# Patient Record
Sex: Female | Born: 2000 | Race: White | Hispanic: No | Marital: Single | Smoking: Never smoker
Health system: Southern US, Community
[De-identification: ages and names within clinical notes are randomized; demographics above are authoritative.]

---

## 2020-05-30 ENCOUNTER — Emergency Department (HOSPITAL_COMMUNITY): Payer: BLUE CROSS/BLUE SHIELD

## 2020-05-30 ENCOUNTER — Emergency Department (HOSPITAL_COMMUNITY)
Admission: EM | Admit: 2020-05-30 | Discharge: 2020-05-30 | Disposition: A | Discharge status: Home / Self Care | Payer: BLUE CROSS/BLUE SHIELD | Attending: Emergency Medicine | Admitting: Emergency Medicine

## 2020-05-30 DIAGNOSIS — R569 Unspecified convulsions: Secondary | ICD-10-CM | POA: Diagnosis not present

## 2020-05-30 LAB — I-STAT BETA HCG BLOOD, ED (MC, WL, AP ONLY): I-stat hCG, quantitative: <5 m[IU]/mL (ref <5)

## 2020-05-30 LAB — HEPATIC FUNCTION PANEL
ALT: 12 U/L (ref 0–44)
AST: 27 U/L (ref 15–41)
Albumin: 4.1 g/dL (ref 3.5–5.0)
Alkaline Phosphatase: 54 U/L (ref 38–126)
Bilirubin, Direct: 0.3 mg/dL — ABNORMAL HIGH (ref 0.0–0.2)
Indirect Bilirubin: 0.3 mg/dL (ref 0.3–0.9)
Total Bilirubin: 0.6 mg/dL (ref 0.3–1.2)
Total Protein: 6.6 g/dL (ref 6.5–8.1)

## 2020-05-30 LAB — BASIC METABOLIC PANEL
Anion gap: 10 (ref 5–15)
BUN: 5 mg/dL — ABNORMAL LOW (ref 6–20)
CO2: 18 mmol/L — ABNORMAL LOW (ref 22–32)
Calcium: 8.2 mg/dL — ABNORMAL LOW (ref 8.9–10.3)
Chloride: 106 mmol/L (ref 98–111)
Creatinine, Ser: 0.98 mg/dL (ref 0.44–1.00)
GFR calc Af Amer: >60 mL/min (ref >60)
GFR calc non Af Amer: >60 mL/min (ref >60)
Glucose, Bld: 95 mg/dL (ref 70–99)
Potassium: 3.5 mmol/L (ref 3.5–5.1)
Sodium: 134 mmol/L — ABNORMAL LOW (ref 135–145)

## 2020-05-30 LAB — CBC WITH DIFFERENTIAL/PLATELET
Abs Immature Granulocytes: 0.01 10*3/uL (ref 0.00–0.07)
Basophils Absolute: 0.1 10*3/uL (ref 0.0–0.1)
Basophils Relative: 1 %
Eosinophils Absolute: 0.1 10*3/uL (ref 0.0–0.5)
Eosinophils Relative: 2 %
HCT: 40.1 % (ref 36.0–46.0)
Hemoglobin: 13.9 g/dL (ref 12.0–15.0)
Immature Granulocytes: 0 %
Lymphocytes Relative: 17 %
Lymphs Abs: 1.1 10*3/uL (ref 0.7–4.0)
MCH: 32.3 pg (ref 26.0–34.0)
MCHC: 34.7 g/dL (ref 30.0–36.0)
MCV: 93 fL (ref 80.0–100.0)
Monocytes Absolute: 0.4 10*3/uL (ref 0.1–1.0)
Monocytes Relative: 7 %
Neutro Abs: 4.9 10*3/uL (ref 1.7–7.7)
Neutrophils Relative %: 73 %
Platelets: 408 10*3/uL — ABNORMAL HIGH (ref 150–400)
RBC: 4.31 MIL/uL (ref 3.87–5.11)
RDW: 11.6 % (ref 11.5–15.5)
WBC: 6.6 10*3/uL (ref 4.0–10.5)
nRBC: 0 % (ref 0.0–0.2)

## 2020-05-30 LAB — ETHANOL: Alcohol, Ethyl (B): <10 mg/dL (ref <10)

## 2020-05-30 MED ORDER — SODIUM CHLORIDE 0.9 % IV BOLUS
1000.0000 mL | Freq: Once | INTRAVENOUS | Status: AC
  Administered 2020-05-30: 1000 mL via INTRAVENOUS

## 2020-05-30 NOTE — ED Provider Notes (Signed)
[facility] EMERGENCY DEPARTMENT Provider Note   CSN: [id number] Arrival date & time: 05/30/20  1719     History Chief Complaint  Patient presents with  . Seizures    Gloria Gay is a 19 y.o. female.  The history is provided by the patient.  Loss of Consciousness Episode history:  Single Most recent episode:  Today Progression:  Resolved Chronicity:  New Context: normal activity   Witnessed: yes   Relieved by:  Nothing Worsened by:  Nothing Associated symptoms: seizures (seizure like activity)   Associated symptoms: no chest pain, no confusion, no fever, no malaise/fatigue, no palpitations, no shortness of breath and no vomiting   Risk factors: no seizures        No past medical history on file.  There are no problems to display for this patient.  PMH: anxiety   OB History   No obstetric history on file.     No family history on file.  Social History   Tobacco Use  . Smoking status: Not on file  Substance Use Topics  . Alcohol use: Not on file  . Drug use: Not on file    Home Medications Prior to Admission medications   Not on File    Allergies    Patient has no allergy information on record.  Review of Systems   Review of Systems  Constitutional: Negative for chills, fever and malaise/fatigue.  HENT: Negative for ear pain and sore throat.   Eyes: Negative for pain and visual disturbance.  Respiratory: Negative for cough and shortness of breath.   Cardiovascular: Positive for syncope. Negative for chest pain and palpitations.  Gastrointestinal: Negative for abdominal pain and vomiting.  Genitourinary: Negative for dysuria and hematuria.  Musculoskeletal: Negative for arthralgias and back pain.  Skin: Negative for color change and rash.  Neurological: Positive for seizures (seizure like activity) and syncope.  Psychiatric/Behavioral: Negative for confusion.  All other systems reviewed and are negative.   Physical  Exam Updated Vital Signs  ED Triage Vitals  Enc Vitals Group     BP 05/30/20 1727 125/86     Pulse Rate 05/30/20 1727 92     Resp 05/30/20 1727 19     Temp --      Temp src --      SpO2 05/30/20 1727 100 %     Weight 05/30/20 1727 124 lb (56.2 kg)     Height 05/30/20 1727 5\' 2"  (1.575 m)     Head Circumference --      Peak Flow --      Pain Score 05/30/20 1725 0     Pain Loc --      Pain Edu? --      Excl. in GC? --     Physical Exam Vitals and nursing note reviewed.  Constitutional:      General: She is not in acute distress.    Appearance: She is well-developed. She is not ill-appearing.  HENT:     Head: Normocephalic and atraumatic.     Right Ear: Tympanic membrane normal.     Left Ear: Tympanic membrane normal.     Nose: Nose normal.     Mouth/Throat:     Mouth: Mucous membranes are moist.  Eyes:     Extraocular Movements: Extraocular movements intact.     Conjunctiva/sclera: Conjunctivae normal.     Pupils: Pupils are equal, round, and reactive to light.  Cardiovascular:     Rate and Rhythm: Normal rate  and regular rhythm.     Pulses: Normal pulses.     Heart sounds: Normal heart sounds. No murmur heard.   Pulmonary:     Effort: Pulmonary effort is normal. No respiratory distress.     Breath sounds: Normal breath sounds.  Abdominal:     Palpations: Abdomen is soft.     Tenderness: There is no abdominal tenderness.  Musculoskeletal:     Cervical back: Normal range of motion and neck supple.  Skin:    General: Skin is warm and dry.     Capillary Refill: Capillary refill takes less than 2 seconds.  Neurological:     General: No focal deficit present.     Mental Status: She is alert and oriented to person, place, and time.     Cranial Nerves: No cranial nerve deficit.     Sensory: No sensory deficit.     Motor: No weakness.     Coordination: Coordination normal.     Gait: Gait normal.     Comments: 5+ out of 5 strength throughout, normal sensation, no  drift, normal finger-nose-finger, normal speech     ED Results / Procedures / Treatments   Labs (all labs ordered are listed, but only abnormal results are displayed) Labs Reviewed  CBC WITH DIFFERENTIAL/PLATELET - Abnormal; Notable for the following components:      Result Value   Platelets 408 (*)    All other components within normal limits  BASIC METABOLIC PANEL - Abnormal; Notable for the following components:   Sodium 134 (*)    CO2 18 (*)    BUN 5 (*)    Calcium 8.2 (*)    All other components within normal limits  HEPATIC FUNCTION PANEL - Abnormal; Notable for the following components:   Bilirubin, Direct 0.3 (*)    All other components within normal limits  ETHANOL  RAPID URINE DRUG SCREEN, HOSP PERFORMED  I-STAT BETA HCG BLOOD, ED ([facility], [facility], [facility] ONLY)    EKG EKG Interpretation  Date/Time:  Saturday May 30 2020 17:28:55 EDT Ventricular Rate:  98 PR Interval:    QRS Duration: 92 QT Interval:  344 QTC Calculation: 440 R Axis:   79 Text Interpretation: Sinus rhythm Right atrial enlargement Consider right ventricular hypertrophy Confirmed by Virgina Norfolk [phone]) on 05/30/2020 5:30:19 PM   Radiology CT Head Wo Contrast  Result Date: 05/30/2020 CLINICAL DATA:  Seizure, nontraumatic (Age 75-40y) EXAM: CT HEAD WITHOUT CONTRAST TECHNIQUE: Contiguous axial images were obtained from the base of the skull through the vertex without intravenous contrast. COMPARISON:  None. FINDINGS: Brain: No evidence of acute infarction, hemorrhage, hydrocephalus, extra-axial collection or mass lesion/mass effect. Vascular: No hyperdense vessel or unexpected calcification. Skull: Negative for fracture or focal lesion. Sinuses/Orbits: Mild mucosal thickening of the left maxillary sinus. Otherwise paranasal sinuses and mastoid air cells are clear. Other: None. IMPRESSION: No acute intracranial abnormality. Electronically Signed   By: Tish Frederickson M.D.   On: 05/30/2020 19:15   DG Chest  Portable 1 View  Result Date: 05/30/2020 CLINICAL DATA:  Seizure-like episode. EXAM: PORTABLE CHEST 1 VIEW COMPARISON:  None. FINDINGS: The heart size and mediastinal contours are within normal limits. Both lungs are clear. The visualized skeletal structures are unremarkable. The right costophrenic angle is outside the field of view and therefore cannot be fully evaluated. There is an azygos lobe, a normal variant. IMPRESSION: No active disease. Electronically Signed   By: Katherine Mantle M.D.   On: 05/30/2020 19:33    Procedures  Procedures (including critical care time)  Medications Ordered in ED Medications  sodium chloride 0.9 % bolus 1,000 mL (0 mLs Intravenous Stopped 05/30/20 1939)    ED Course  I have reviewed the triage vital signs and the nursing notes.  Pertinent labs & imaging results that were available during my care of the patient were reviewed by me and considered in my medical decision making (see chart for details).    MDM Rules/Calculators/A&P                          Laurence Folz is a 19 year old female history of anxiety who presents to the ED after seizure-like episode.  Patient with overall unremarkable vitals.  Had seizure-like/syncopal event while at a store.  May be postictal.  Did not bite her tongue or have any incontinence.  No history of seizures.  Denies any extremity pain.  Feels anxious but otherwise feels okay.  Denies any heavy alcohol use.  Occasionally uses marijuana.  Patient is on some antidepressants and antianxiety pills.  She is on Bactrim for cellulitis at this time.  Lab work showed no significant anemia, ocular abnormality, kidney injury.  CT scan unremarkable.  EKG shows sinus rhythm.  Pregnancy test is negative.  Overall work-up is unremarkable.  No other seizure or syncopal events on the ED.  No significant anemia.  Given seizure precautions we will have her follow-up with neurology.  No driving her dangerous activities until cleared by  neurology.  No concern for infectious process such as meningitis.  Knows to return to the ED if symptoms worsen.  Discharged in good condition.  This chart was dictated using voice recognition software.  Despite best efforts to proofread,  errors can occur which can change the documentation meaning.    Final Clinical Impression(s) / ED Diagnoses Final diagnoses:  Seizure-like activity (HCC)    Rx / DC Orders ED Discharge Orders         Ordered    Ambulatory referral to Neurology       Comments: An appointment is requested in approximately: 1 week   05/30/20 2054           Virgina Norfolk, DO 05/30/20 2123

## 2020-05-30 NOTE — ED Triage Notes (Signed)
Pt was BIB by [facility] for seizure witnessed by bystanders, no hx seizures. Seizure lasted approximately 3 minutes. On EMS arrival patient was awake but disoriented. [facility] arrival, sinus tachy for [facility] (up to 140).  CBG 144 for [facility].  Denies ETOH/drug use

## 2020-05-30 NOTE — Discharge Instructions (Addendum)
Do not drink or operate heavy knee heavy machinery or partake in dangerous activities until you are cleared by neurology.  Please return to the ED if symptoms recur.  Please avoid any alcohol or drugs as well.

## 2022-01-11 IMAGING — CT CT HEAD W/O CM
4 series · 16 of 47 positions shown, 18 images · non-contrast
Comparison: None.

CLINICAL DATA: Seizure, nontraumatic (Age 18-40y)

EXAM:
CT HEAD WITHOUT CONTRAST
TECHNIQUE: Contiguous axial images were obtained from the base of the skull
through the vertex without intravenous contrast.

[Series 3: head wo · axial · 0.42mm/px · z∈[-130,-10]mm · 7 of 33 slices shown, 9 images]
[im 5/33  brain]
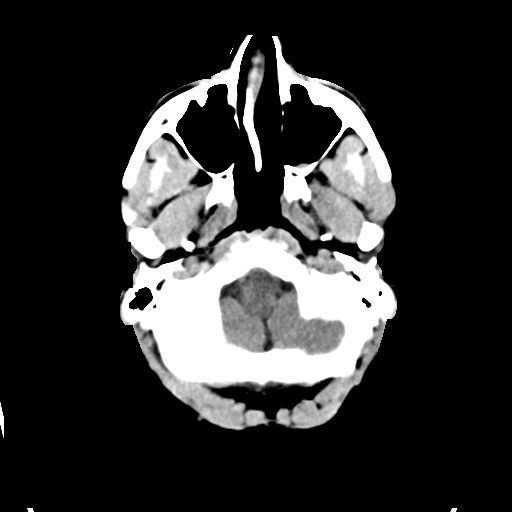
[im 5/33  bone]
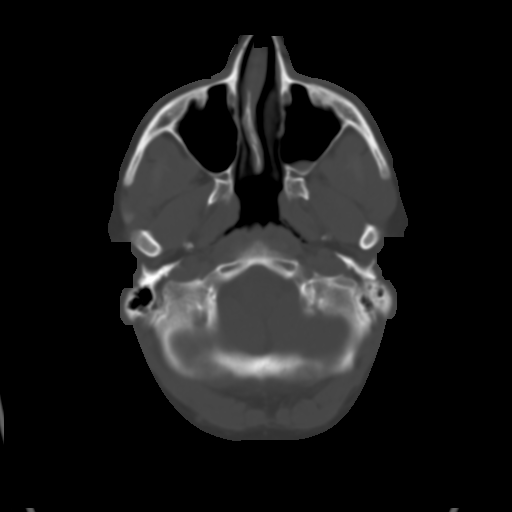
[im 9/33  brain]
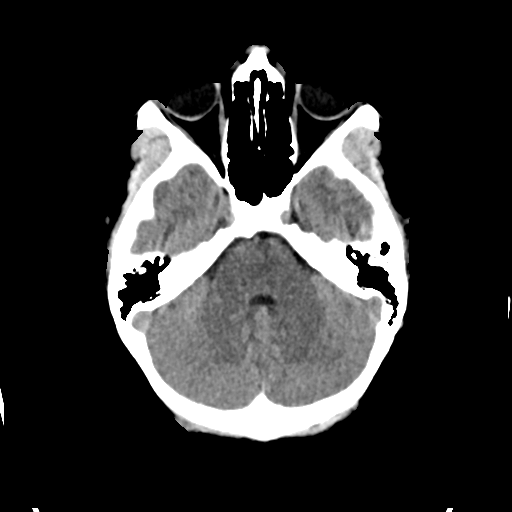
[im 13/33  brain]
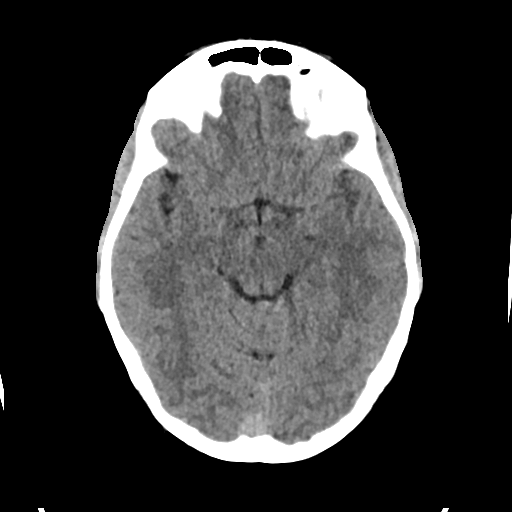
[im 17/33  brain]
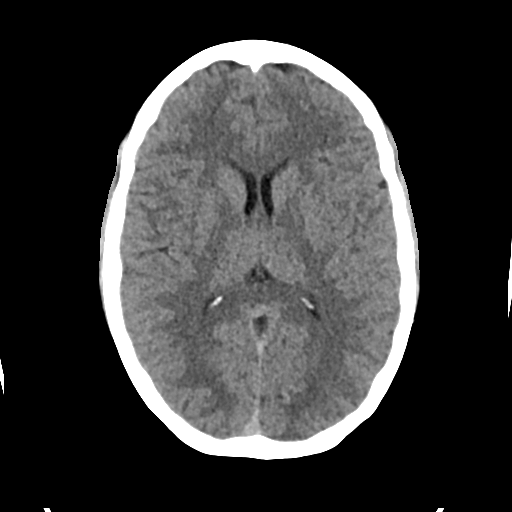
[im 21/33  brain]
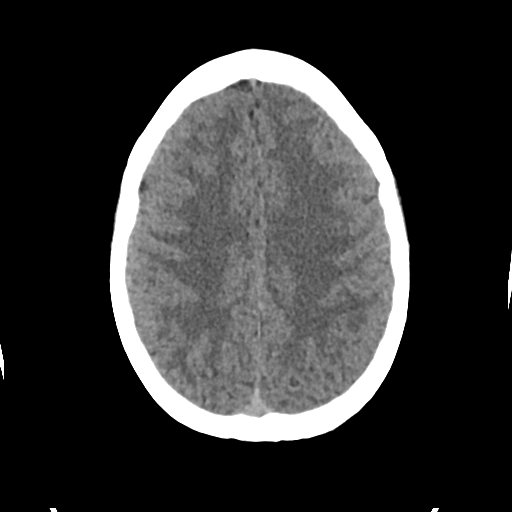
[im 21/33  bone]
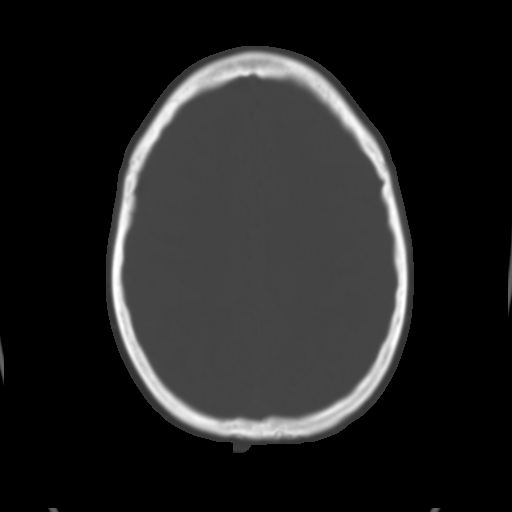
[im 25/33  brain]
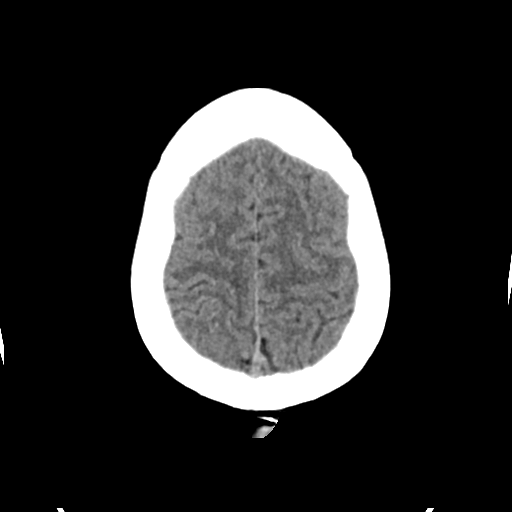
[im 29/33  brain]
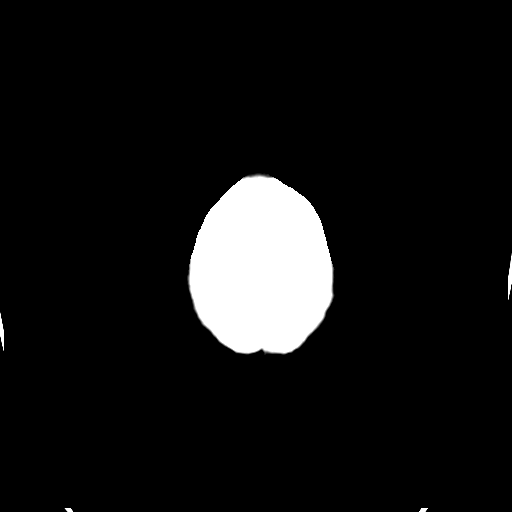

[Series 4: head bone · axial · 0.42mm/px · z∈[-134,-102]mm · 3 of 81 slices shown]
[im 9/81  bone]
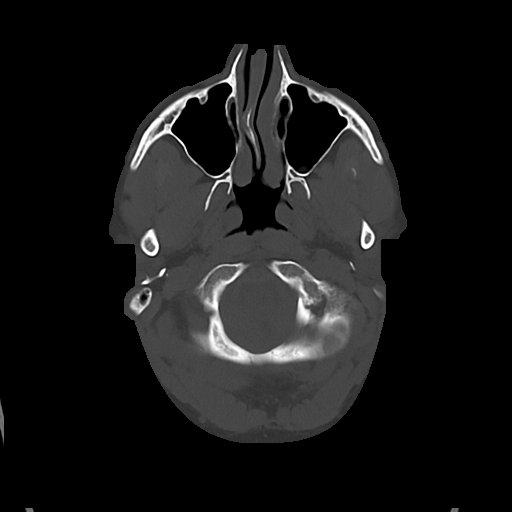
[im 17/81  bone]
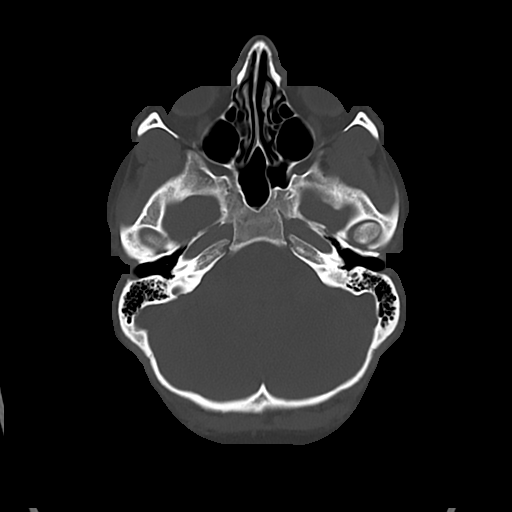
[im 25/81  bone]
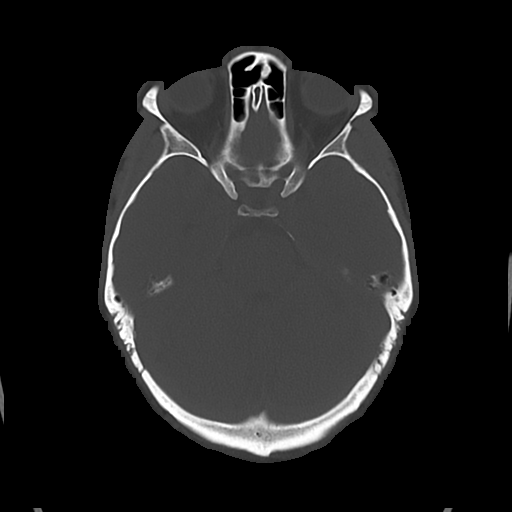

[Series 5: cor soft · coronal · 0.34mm/px · 3 of 71 slices shown]
[im 24/71  brain]
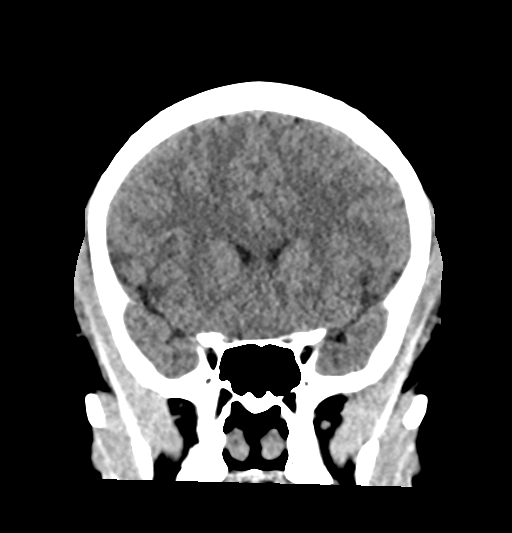
[im 32/71  brain]
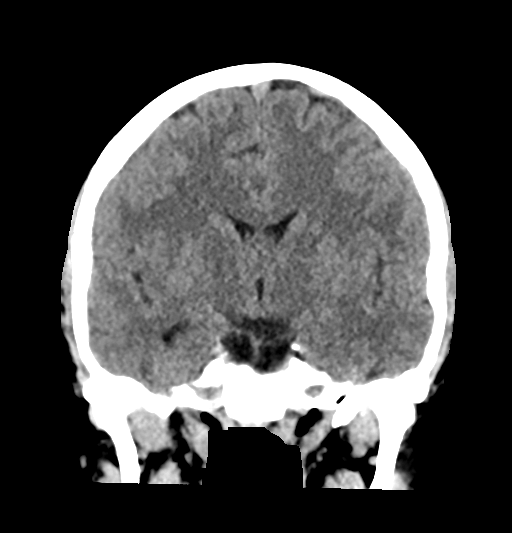
[im 39/71  brain]
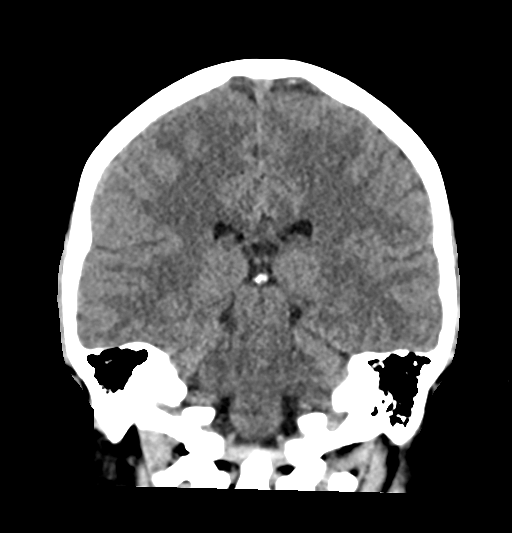

[Series 6: sag soft · sagittal · 0.35mm/px · 3 of 58 slices shown]
[im 20/58  brain]
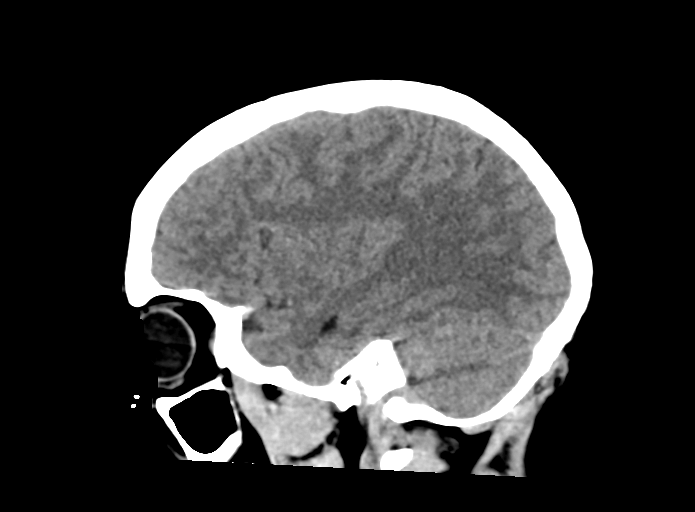
[im 29/58  brain]
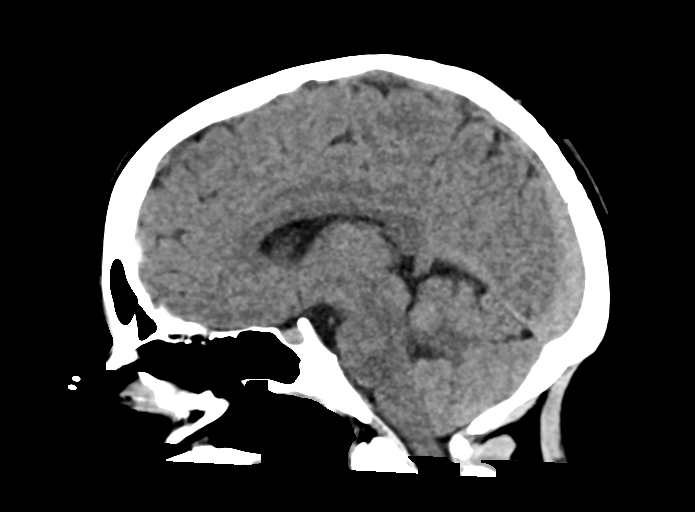
[im 39/58  brain]
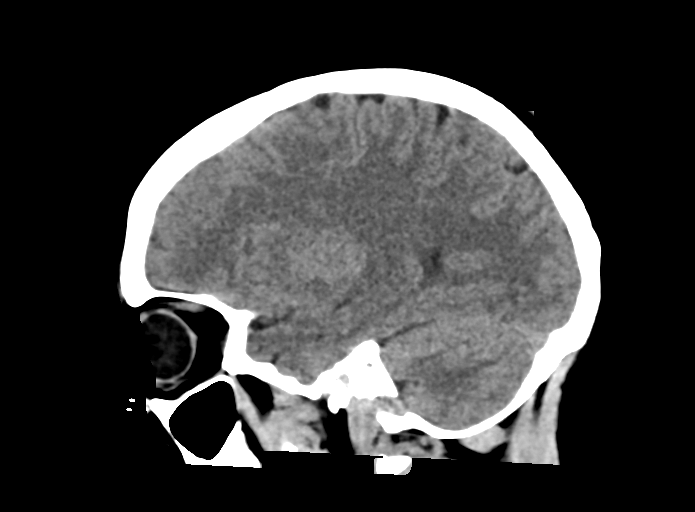

[16 of 47 positions shown; findings below may reference images not displayed]

FINDINGS: Brain: No evidence of acute infarction, hemorrhage, hydrocephalus,
extra-axial collection or mass lesion/mass effect.

Vascular: No hyperdense vessel or unexpected calcification.

Skull: Negative for fracture or focal lesion.

Sinuses/Orbits: Mild mucosal thickening of the left maxillary sinus.
Otherwise paranasal sinuses and mastoid air cells are clear.

Other: None.
IMPRESSION: No acute intracranial abnormality.

## 2022-01-11 IMAGING — DX DG CHEST 1V PORT
1 series · 1 of 1 positions shown · non-contrast
Comparison: None.

CLINICAL DATA: Seizure-like episode.

EXAM:
PORTABLE CHEST 1 VIEW

[chest]
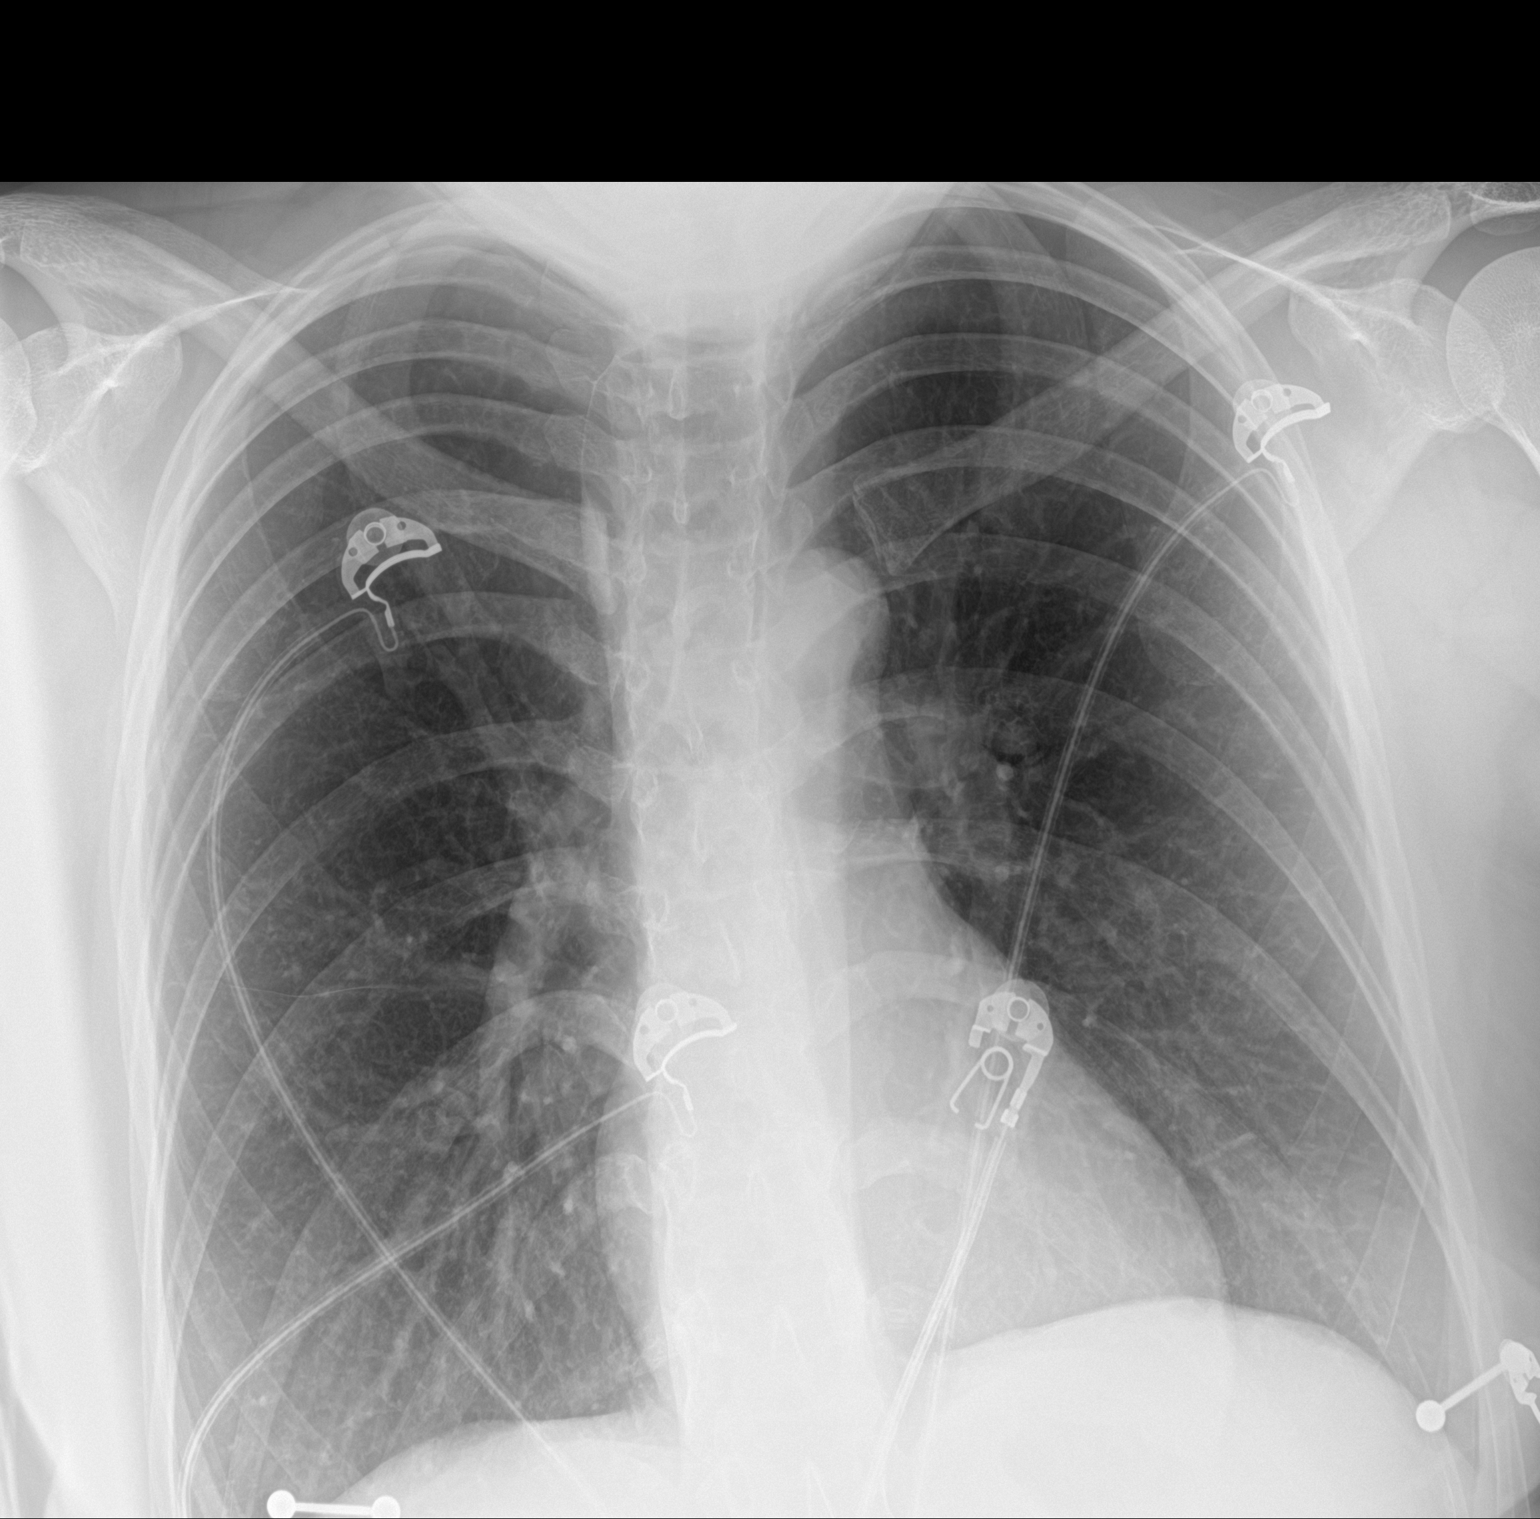

[1 of 1 positions shown; findings below may reference images not displayed]

FINDINGS: The heart size and mediastinal contours are within normal limits.
Both lungs are clear. The visualized skeletal structures are
unremarkable. The right costophrenic angle is outside the field of
view and therefore cannot be fully evaluated. There is an azygos
lobe, a normal variant.
IMPRESSION: No active disease.

## 2022-02-27 ENCOUNTER — Ambulatory Visit: Payer: Self-pay

## 2022-07-06 ENCOUNTER — Ambulatory Visit (HOSPITAL_COMMUNITY)
Admission: RE | Admit: 2022-07-06 | Discharge: 2022-07-06 | Disposition: A | Discharge status: Home / Self Care | Payer: BC Managed Care – PPO | Source: Ambulatory Visit | Attending: Emergency Medicine | Admitting: Emergency Medicine

## 2022-07-06 ENCOUNTER — Encounter (HOSPITAL_COMMUNITY): Payer: Self-pay

## 2022-07-06 VITALS — BP 118/71 | HR 72 | Temp 98.1°F | Resp 18

## 2022-07-06 DIAGNOSIS — N6320 Unspecified lump in the left breast, unspecified quadrant: Secondary | ICD-10-CM

## 2022-07-06 NOTE — ED Provider Notes (Signed)
[facility]    CSN: [id number] Arrival date & time: 07/06/22  1405     History   Chief Complaint Chief Complaint  Patient presents with   Abscess    Possible abscess from a nipple piercing - Entered by patient    HPI Gloria Gay is a 21 y.o. female.  Presents with tender area of the left breast. Wondered if it was infection from her nipple piercing Piercings done 2 years ago She has noticed a pimple-like spot on the left breast that comes and goes every few weeks  LMP 10/18  Saw her ob/gyn last month but didn't have the mass at that time. Also that office located in [place]   History reviewed. No pertinent past medical history.  There are no problems to display for this patient.   History reviewed. No pertinent surgical history.  OB History   No obstetric history on file.      Home Medications    Prior to Admission medications   Not on File    Family History History reviewed. No pertinent family history.  Social History Social History   Tobacco Use   Smoking status: Never   Smokeless tobacco: Never     Allergies   Patient has no known allergies.   Review of Systems Review of Systems  Per HPI  Physical Exam Triage Vital Signs ED Triage Vitals  Enc Vitals Group     BP 07/06/22 1442 118/71     Pulse Rate 07/06/22 1442 72     Resp 07/06/22 1442 18     Temp 07/06/22 1442 98.1 F (36.7 C)     Temp Source 07/06/22 1442 Oral     SpO2 07/06/22 1442 98 %     Weight --      Height --      Head Circumference --      Peak Flow --      Pain Score 07/06/22 1443 0     Pain Loc --      Pain Edu? --      Excl. in GC? --    No data found.  Updated Vital Signs BP 118/71 (BP Location: Left Arm)   Pulse 72   Temp 98.1 F (36.7 C) (Oral)   Resp 18   SpO2 98%    Physical Exam Vitals and nursing note reviewed. Exam conducted with a chaperone present [place]).  Constitutional:      General: She is not in acute distress. HENT:      Mouth/Throat:     Pharynx: Oropharynx is clear.  Cardiovascular:     Rate and Rhythm: Normal rate and regular rhythm.  Pulmonary:     Effort: Pulmonary effort is normal.  Chest:  Breasts:    Left: Mass present. No swelling, inverted nipple, nipple discharge or skin change.       Comments: Small tender mobile mass left breast, 6 o clock position. No warmth or erythema noted Skin:    General: Skin is warm and dry.  Neurological:     Mental Status: She is alert and oriented to person, place, and time.     UC Treatments / Results  Labs (all labs ordered are listed, but only abnormal results are displayed) Labs Reviewed - No data to display  EKG   Radiology No results found.  Procedures Procedures (including critical care time)  Medications Ordered in UC Medications - No data to display  Initial Impression / Assessment and Plan / UC Course  I have reviewed the triage vital signs and the nursing notes.  Pertinent labs & imaging results that were available during my care of the patient were reviewed by me and considered in my medical decision making (see chart for details).  Breast mass No warmth or erythema, not likely superficial abscess Unknown etiology, does not seem to come and go with her cycles Recommend ultrasound Placed order with breast center - they will contact her to schedule  Ibuprofen or tylenol for pain  Return precautions discussed. Patient agrees to plan  Final Clinical Impressions(s) / UC Diagnoses   Final diagnoses:  Mass of left breast, unspecified quadrant     Discharge Instructions      The breast center should contact you regarding scheduling an ultrasound  If tender, you can try ibuprofen and tylenol.  I do not think this is related to the piercing, but you can remove the piercing if wanted.     ED Prescriptions   None    PDMP not reviewed this encounter.   Elaine Roanhorse, [place], [place] 07/06/22 1553

## 2022-07-06 NOTE — Discharge Instructions (Addendum)
The breast center should contact you regarding scheduling an ultrasound  If tender, you can try ibuprofen and tylenol.  I do not think this is related to the piercing, but you can remove the piercing if wanted.

## 2022-07-06 NOTE — ED Triage Notes (Signed)
Pt reports a possible abscess on the left breast from a nipple piercing. States she has had the piercing for 2 years and feels like the left nipple may be infected. Reports a reoccurring "pimple" that is filled with pus that will go away and come back every couple weeks.

## 2022-07-06 NOTE — ED Notes (Signed)
At bedside for provider exam of nipple piercing / breast

## 2022-07-15 ENCOUNTER — Other Ambulatory Visit: Payer: BLUE CROSS/BLUE SHIELD

## 2022-07-15 ENCOUNTER — Ambulatory Visit
Admission: RE | Admit: 2022-07-15 | Discharge: 2022-07-15 | Disposition: A | Discharge status: Home / Self Care | Payer: BC Managed Care – PPO | Source: Ambulatory Visit | Attending: Emergency Medicine | Admitting: Emergency Medicine

## 2022-07-15 DIAGNOSIS — N6320 Unspecified lump in the left breast, unspecified quadrant: Secondary | ICD-10-CM
